# Patient Record
Sex: Female | Born: 1945 | Race: Black or African American | Hispanic: No | Marital: Single | Smoking: Never smoker
Health system: Southern US, Community
[De-identification: ages and names within clinical notes are randomized; demographics above are authoritative.]

## PROBLEM LIST (undated history)

## (undated) DIAGNOSIS — E785 Hyperlipidemia, unspecified: Secondary | ICD-10-CM

## (undated) DIAGNOSIS — K589 Irritable bowel syndrome without diarrhea: Secondary | ICD-10-CM

## (undated) DIAGNOSIS — M8580 Other specified disorders of bone density and structure, unspecified site: Secondary | ICD-10-CM

## (undated) DIAGNOSIS — T7840XA Allergy, unspecified, initial encounter: Secondary | ICD-10-CM

## (undated) DIAGNOSIS — J45909 Unspecified asthma, uncomplicated: Secondary | ICD-10-CM

## (undated) DIAGNOSIS — I10 Essential (primary) hypertension: Secondary | ICD-10-CM

## (undated) HISTORY — DX: Hyperlipidemia, unspecified: E78.5

## (undated) HISTORY — DX: Essential (primary) hypertension: I10

## (undated) HISTORY — DX: Irritable bowel syndrome without diarrhea: K58.9

## (undated) HISTORY — DX: Unspecified asthma, uncomplicated: J45.909

## (undated) HISTORY — PX: TUBAL LIGATION: SHX77

## (undated) HISTORY — DX: Allergy, unspecified, initial encounter: T78.40XA

## (undated) HISTORY — DX: Other specified disorders of bone density and structure, unspecified site: M85.80

## (undated) HISTORY — PX: ABDOMINAL HYSTERECTOMY: SHX81

## (undated) HISTORY — PX: BREAST SURGERY: SHX581

---

## 2013-11-30 ENCOUNTER — Ambulatory Visit (INDEPENDENT_AMBULATORY_CARE_PROVIDER_SITE_OTHER): Payer: BC Managed Care – PPO

## 2013-11-30 ENCOUNTER — Ambulatory Visit (INDEPENDENT_AMBULATORY_CARE_PROVIDER_SITE_OTHER): Payer: BC Managed Care – PPO | Admitting: Physician Assistant

## 2013-11-30 VITALS — BP 137/85 | HR 60 | Temp 98.4°F | Resp 20 | Ht 61.0 in | Wt 190.1 lb

## 2013-11-30 DIAGNOSIS — M79644 Pain in right finger(s): Secondary | ICD-10-CM

## 2013-11-30 NOTE — Progress Notes (Signed)
   Subjective:    Patient ID: Gabrielle Higgins, female    DOB: 1945/06/02, 68 y.o.   MRN: [id number]   PCP: No PCP Per Patient (Patient is visiting her daughter from [place]).  Chief Complaint  Patient presents with  . Finger Injury    Right Middle Finger-Got caught in the carpet    HPI  This 68 y.o. female presents for evaluation of RIGHT middle finger pain after catching it while pulling up wall-to-wall carpet in her home about a week ago.  It didn't really bother her until today, and she complains of pain all around the knuckle (PIP). She also notes swelling.  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  She left there this morning about 2 am to visit her daughter, who lives here.  Review of Systems     Objective:   Physical Exam  Constitutional: She is oriented to person, place, and time. She appears well-developed and well-nourished. She is active and cooperative. No distress.  BP 137/85  Pulse 60  Temp(Src) 98.4 F (36.9 C) (Oral)  Resp 20  Ht 5\' 1"  (1.549 m)  Wt 190 lb 2 oz (86.24 kg)  BMI 35.94 kg/m2  SpO2 97%   Eyes: Conjunctivae are normal.  Pulmonary/Chest: Effort normal.  Musculoskeletal:       Right wrist: Normal.       Right hand: She exhibits decreased range of motion (RIGHT middle finger, PIP; due to pain), tenderness and swelling (mild, of the middle finger). She exhibits normal capillary refill and no laceration. Normal sensation noted. Normal strength noted.  Neurological: She is alert and oriented to person, place, and time. No sensory deficit.  Psychiatric: She has a normal mood and affect. Her speech is normal and behavior is normal.      RIGHT Middle Finger: [facility] reading (PRIMARY) by  Dr. Perrin MalteseGuest. Normal bony structures. Minimal soft tissue swelling.      Assessment & Plan:  1. Pain of finger of right hand Fold-over finger splint. OTC NSAIDS. If symptoms persist, would seek hand specialist evaluation. - DG  Finger Middle Right; Future   Fernande Brashelle S. Deanna Boehlke, PA-C Physician Assistant-Certified [facility]

## 2014-12-27 ENCOUNTER — Other Ambulatory Visit: Payer: Self-pay

## 2015-07-05 IMAGING — CR DG FINGER MIDDLE 2+V*R*
1 series · 1 of 1 positions shown · non-contrast
Comparison: None.

CLINICAL DATA: Right long finger injury, pain.  Initial encounter.

EXAM:
RIGHT MIDDLE FINGER 2+V

[PA]
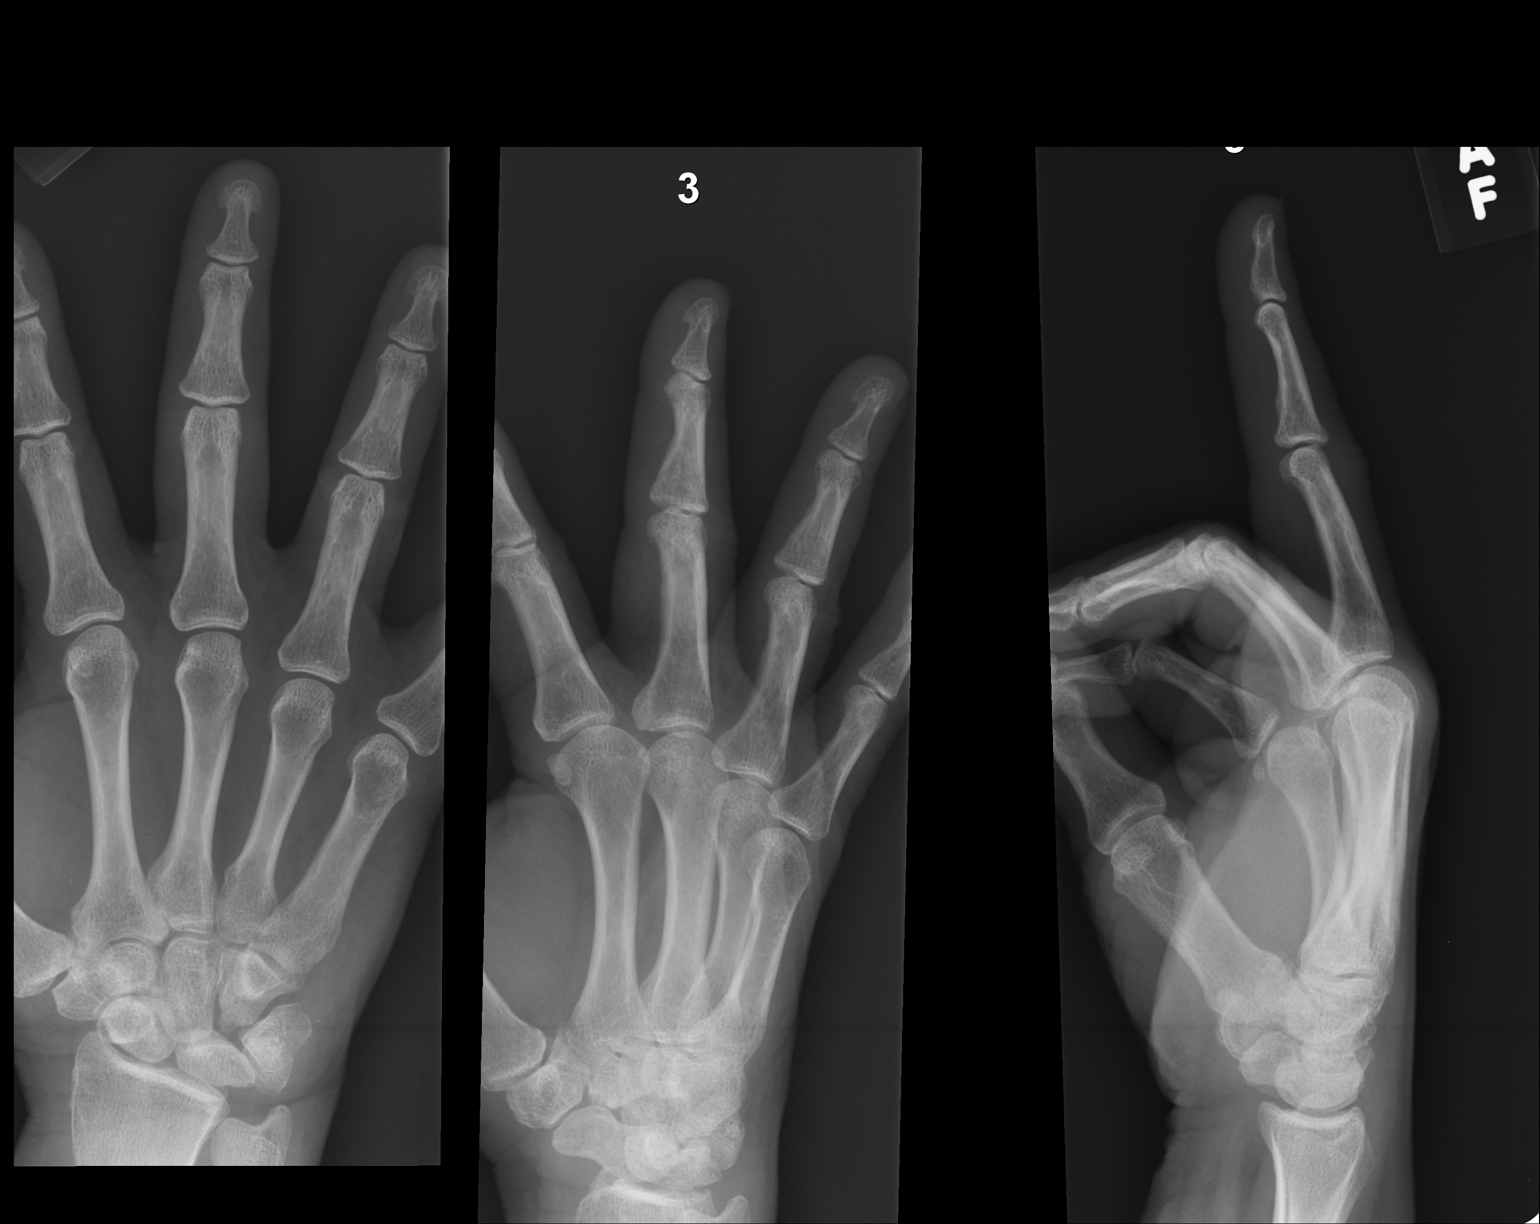

[1 of 1 positions shown; findings below may reference images not displayed]

FINDINGS: Imaged bones, joints and soft tissues appear normal.
IMPRESSION: Negative exam.

## 2019-01-15 ENCOUNTER — Emergency Department
Admission: EM | Admit: 2019-01-15 | Discharge: 2019-01-15 | Disposition: A | Discharge status: Other Acute Inpatient Hospital | Payer: Medicare HMO | Attending: Emergency Medicine | Admitting: Emergency Medicine

## 2019-01-15 ENCOUNTER — Emergency Department: Payer: Medicare HMO

## 2019-01-15 DIAGNOSIS — Z20828 Contact with and (suspected) exposure to other viral communicable diseases: Secondary | ICD-10-CM | POA: Insufficient documentation

## 2019-01-15 DIAGNOSIS — I4891 Unspecified atrial fibrillation: Secondary | ICD-10-CM | POA: Insufficient documentation

## 2019-01-15 DIAGNOSIS — E785 Hyperlipidemia, unspecified: Secondary | ICD-10-CM | POA: Insufficient documentation

## 2019-01-15 DIAGNOSIS — I10 Essential (primary) hypertension: Secondary | ICD-10-CM | POA: Insufficient documentation

## 2019-01-15 LAB — CBC AND DIFFERENTIAL
Absolute NRBC: 0 10*3/uL (ref 0.00–0.00)
Basophils Absolute Automated: 0.03 10*3/uL (ref 0.00–0.08)
Basophils Automated: 0.4 %
Eosinophils Absolute Automated: 0.16 10*3/uL (ref 0.00–0.44)
Eosinophils Automated: 2.2 %
Hematocrit: 41.5 % (ref 34.7–43.7)
Hgb: 13.6 g/dL (ref 11.4–14.8)
Immature Granulocytes Absolute: 0.02 10*3/uL (ref 0.00–0.07)
Immature Granulocytes: 0.3 %
Lymphocytes Absolute Automated: 2.52 10*3/uL (ref 0.42–3.22)
Lymphocytes Automated: 34.3 %
MCH: 30.6 pg (ref 25.1–33.5)
MCHC: 32.8 g/dL (ref 31.5–35.8)
MCV: 93.3 fL (ref 78.0–96.0)
MPV: 11.3 fL (ref 8.9–12.5)
Monocytes Absolute Automated: 0.8 10*3/uL (ref 0.21–0.85)
Monocytes: 10.9 %
Neutrophils Absolute: 3.82 10*3/uL (ref 1.10–6.33)
Neutrophils: 51.9 %
Nucleated RBC: 0 /100 WBC (ref 0.0–0.0)
Platelets: 235 10*3/uL (ref 142–346)
RBC: 4.45 10*6/uL (ref 3.90–5.10)
RDW: 13 % (ref 11–15)
WBC: 7.35 10*3/uL (ref 3.10–9.50)

## 2019-01-15 LAB — URINALYSIS REFLEX TO MICROSCOPIC EXAM - REFLEX TO CULTURE
Bilirubin, UA: NEGATIVE
Blood, UA: NEGATIVE
Glucose, UA: NEGATIVE
Ketones UA: NEGATIVE
Nitrite, UA: NEGATIVE
Protein, UR: NEGATIVE
Specific Gravity UA: 1.005 (ref 1.001–1.035)
Urine pH: 7 (ref 5.0–8.0)
Urobilinogen, UA: NEGATIVE mg/dL (ref 0.2–2.0)

## 2019-01-15 LAB — COMPREHENSIVE METABOLIC PANEL
ALT: 37 U/L (ref 0–55)
AST (SGOT): 33 U/L (ref 5–34)
Albumin/Globulin Ratio: 1.3 (ref 0.9–2.2)
Albumin: 3.8 g/dL (ref 3.5–5.0)
Alkaline Phosphatase: 72 U/L (ref 37–106)
Anion Gap: 12 (ref 5.0–15.0)
BUN: 15 mg/dL (ref 7.0–19.0)
Bilirubin, Total: 0.3 mg/dL (ref 0.2–1.2)
CO2: 21 mEq/L — ABNORMAL LOW (ref 22–29)
Calcium: 9.5 mg/dL (ref 7.9–10.2)
Chloride: 109 mEq/L (ref 100–111)
Creatinine: 0.8 mg/dL (ref 0.6–1.0)
Globulin: 3 g/dL (ref 2.0–3.6)
Glucose: 130 mg/dL — ABNORMAL HIGH (ref 70–100)
Potassium: 3.8 mEq/L (ref 3.5–5.1)
Protein, Total: 6.8 g/dL (ref 6.0–8.3)
Sodium: 142 mEq/L (ref 136–145)

## 2019-01-15 LAB — PT AND APTT
PT INR: 0.9 (ref 0.9–1.1)
PT: 12.2 s — ABNORMAL LOW (ref 12.6–15.0)
PTT: 27 s (ref 23–37)

## 2019-01-15 LAB — COVID-19 (SARS-COV-2): SARS CoV 2 Overall Result: NEGATIVE

## 2019-01-15 LAB — HEMOLYSIS INDEX: Hemolysis Index: 18 (ref 0–18)

## 2019-01-15 LAB — TSH: TSH: 2.52 u[IU]/mL (ref 0.35–4.94)

## 2019-01-15 LAB — GFR: EGFR: >60

## 2019-01-15 LAB — TROPONIN I: Troponin I: <0.01 ng/mL (ref 0.00–0.05)

## 2019-01-15 MED ORDER — DILTIAZEM HCL 5 MG/ML IV SOLN (WRAP)
20.00 mg | Freq: Once | INTRAVENOUS | Status: AC
Start: 2019-01-15 — End: 2019-01-15
  Administered 2019-01-15: 08:00:00 20 mg via INTRAVENOUS
  Filled 2019-01-15: qty 5

## 2019-01-15 MED ORDER — DILTIAZEM HCL 125 MG/125 ML IN DEXTROSE 5% IV SOLN
2.50 mg/h | INTRAVENOUS | Status: DC
Start: 2019-01-15 — End: 2019-01-15
  Administered 2019-01-15: 08:00:00 5 mg/h via INTRAVENOUS
  Filled 2019-01-15: qty 125

## 2019-01-15 MED ORDER — FAMOTIDINE 10 MG/ML IV SOLN (WRAP)
20.00 mg | Freq: Once | INTRAVENOUS | Status: AC
Start: 2019-01-15 — End: 2019-01-15
  Administered 2019-01-15: 09:00:00 20 mg via INTRAVENOUS
  Filled 2019-01-15: qty 2

## 2019-01-15 MED ORDER — ONDANSETRON HCL 4 MG/2ML IJ SOLN
4.00 mg | Freq: Once | INTRAMUSCULAR | Status: DC
Start: 2019-01-15 — End: 2019-01-15
  Filled 2019-01-15: qty 2

## 2019-01-15 MED ORDER — SODIUM CHLORIDE 0.9 % IV BOLUS
1000.00 mL | Freq: Once | INTRAVENOUS | Status: AC
Start: 2019-01-15 — End: 2019-01-15
  Administered 2019-01-15: 08:00:00 1000 mL via INTRAVENOUS

## 2019-01-15 NOTE — ED Triage Notes (Signed)
Jodi Freeman is a 73 y.o. female from home by Medic,for new onset of Afib HR 151 with CP, lightheaded upon waking up for work.   BP (!) 133/91    Pulse (!) 151    Temp 98.1 F (36.7 C) (Oral)    Resp (!) 30    Ht 5\' 1"  (1.549 m)    Wt 81.6 kg    SpO2 98%    BMI 34.01 kg/m

## 2019-01-15 NOTE — ED Notes (Signed)
Bed: ZO10  Expected date: 01/15/19  Expected time:   Means of arrival: [facility]  Comments:  Charity fundraiser

## 2019-01-15 NOTE — ED Notes (Signed)
Patient took 4-81mg  aspirin PTA

## 2019-01-15 NOTE — ED Notes (Signed)
Report given to [facility], patient transferred with belongings and diltiazem infusing

## 2019-01-15 NOTE — ED Provider Notes (Signed)
EMERGENCY DEPARTMENT HISTORY AND PHYSICAL EXAM     None        Date: 01/15/2019  Patient Name: Jodi Freeman    History of Presenting Illness     Chief Complaint   Patient presents with    Chest Pain    Atrial Fibrillation     151 HR        History Provided By: patient  Chief Complaint: palpitations, feels like she is going to pass out  Onset: this am  Timing: sudden  Location: cv  Quality: heart racing  Severity: severe  Modifying Factors: none  Associated Symptoms: feels fatigued    Additional History: Jodi Freeman is a 73 y.o. female.here via [place], started to get ready to go to work, felt extremely fatigued then her heart started to race and she felt like she was going to black out. No prior hx of similar symptoms. No cp or sob. No fever/cough. She is a Engineer, civil (consulting).     PCP: Pcp, Kathreen Cosier, MD      Current Facility-Administered Medications   Medication Dose Route Frequency Provider Last Rate Last Admin    dilTIAZem (CARDIZEM) 125 mg in dextrose 5% 125 mL premix  2.5-15 mg/hr Intravenous Continuous Elliot Cousin, MD 2.5 mL/hr at 01/15/19 0915 2.5 mg/hr at 01/15/19 0915     Current Outpatient Medications   Medication Sig Dispense Refill    albuterol (PROVENTIL) (2.5 MG/3ML) 0.083% nebulizer solution Take 2.5 mg by nebulization every 6 (six) hours as needed for Wheezing      atorvastatin (LIPITOR) 40 MG tablet Take 40 mg by mouth nightly      calcium carbonate (Calcium 600) 600 MG Tab tablet Take 600 mg by mouth 2 (two) times daily with meals      cyclobenzaprine (FLEXERIL) 5 MG tablet Take 5 mg by mouth 3 (three) times daily as needed for Muscle spasms      dicyclomine (BENTYL) 10 MG capsule Take 10 mg by mouth 3 (three) times daily before meals as needed      Multiple Vitamins-Minerals (CENTRUM SILVER PO) Take by mouth      NIFEdipine ER (ADALAT CC) 30 MG 24 hr tablet Take 30 mg by mouth daily      Omega-3 Fatty Acids (Omega-3 Fish Oil) 500 MG Cap Take by mouth         Past History     Past  Medical History:  Past Medical History:   Diagnosis Date    Asthma     HLD (hyperlipidemia)     Hypertension     IBS (irritable bowel syndrome)        Past Surgical History:  History reviewed. No pertinent surgical history.    Family History:  No family history on file.    Social History:  Social History     Tobacco Use    Smoking status: Never Smoker    Smokeless tobacco: Never Used   Substance Use Topics    Alcohol use: Not Currently     Comment: socially    Drug use: Never       Allergies:  Allergies   Allergen Reactions    Ciprofloxacin     Dye [Iodinated Diagnostic Agents]     Sulfa Antibiotics        Review of Systems     Review of Systems   Constitutional: Negative for chills and fever.   Cardiovascular: Positive for palpitations. Negative for chest pain.   Neurological: Positive for dizziness.  Psychiatric/Behavioral: Negative for substance abuse and suicidal ideas.   All other systems reviewed and are negative.      Physical Exam   BP 122/57    Pulse 71    Temp 98.1 F (36.7 C) (Oral)    Resp 20    Ht 5\' 1"  (1.549 m)    Wt 81.6 kg    SpO2 98%    BMI 34.01 kg/m     Physical Exam   Constitutional: She is oriented to person, place, and time and well-developed, well-nourished, and in no distress. No distress.   HENT:   Head: Normocephalic and atraumatic.   Eyes: Conjunctivae are normal. Right eye exhibits no discharge. Left eye exhibits no discharge. No scleral icterus.   Cardiovascular: Intact distal pulses.   Irr, irr  tachycardic   Pulmonary/Chest: Effort normal and breath sounds normal. No respiratory distress. She has no wheezes. She has no rales.   Abdominal: Soft. Bowel sounds are normal. She exhibits no distension. There is no abdominal tenderness. There is no rebound and no guarding.   Musculoskeletal:         General: No edema.   Neurological: She is alert and oriented to person, place, and time. GCS score is 15.   Skin: Skin is warm and dry. She is not diaphoretic.   Psychiatric: Mood  and affect normal.       Diagnostic Study Results     Labs -     Results     Procedure Component Value Units Date/Time    COVID-19 (SARS-COV-2) Verne Carrow Rapid) [[id number]] Collected: 01/15/19 0813    Specimen: Nasopharyngeal Swab from Nasopharynx Updated: 01/15/19 0844     Purpose of COVID testing Screening     SARS-CoV-2 Specimen Source Nasopharyngeal     SARS CoV 2 Overall Result Negative    Narrative:      o Collect and clearly label specimen type:  o Upper respiratory specimen: One Nasopharyngeal Dry Swab NO  Transport Media.  o Hand deliver to laboratory ASAP    UA Reflex to Micro - Reflex to Culture [[id number]]  (Abnormal) Collected: 01/15/19 0813     Updated: 01/15/19 0825     Urine Type Urine, Clean Ca     Color, UA Colorless     Clarity, UA Clear     Specific Gravity UA 1.005     Urine pH 7.0     Leukocyte Esterase, UA Trace     Nitrite, UA Negative     Protein, UR Negative     Glucose, UA Negative     Ketones UA Negative     Urobilinogen, UA Negative mg/dL      Bilirubin, UA Negative     Blood, UA Negative     RBC, UA 0 - 2 /hpf      WBC, UA 0 - 5 /hpf      Squamous Epithelial Cells, Urine 0 - 5 /hpf      Hyaline Casts, UA 0 - 3 /lpf      Urine Mucus Present    Narrative:      Replace urinary catheter prior to obtaining the urine culture  if it has been in place for greater than or equal to 14  days:->N/A No Foley  Indications for U/A Reflex to Micro - Reflex to  Culture:->Suprapubic Pain/Tenderness or Dysuria    TSH [[id number]] Collected: 01/15/19 0641    Specimen: Blood Updated: 01/15/19 0731     TSH 2.52 uIU/mL  Narrative:      Replace urinary catheter prior to obtaining the urine culture  if it has been in place for greater than or equal to 14  days:->N/A No Foley  Indications for U/A Reflex to Micro - Reflex to  Culture:->Suprapubic Pain/Tenderness or Dysuria    Hemolysis index [[id number]] Collected: 01/15/19 0641     Updated: 01/15/19 0731     Hemolysis Index 18    Narrative:      Replace urinary  catheter prior to obtaining the urine culture  if it has been in place for greater than or equal to 14  days:->N/A No Foley  Indications for U/A Reflex to Micro - Reflex to  Culture:->Suprapubic Pain/Tenderness or Dysuria    GFR [[id number]] Collected: 01/15/19 0641     Updated: 01/15/19 0731     EGFR >60.0    Narrative:      Replace urinary catheter prior to obtaining the urine culture  if it has been in place for greater than or equal to 14  days:->N/A No Foley  Indications for U/A Reflex to Micro - Reflex to  Culture:->Suprapubic Pain/Tenderness or Dysuria    Comprehensive metabolic panel [[id number]]  (Abnormal) Collected: 01/15/19 0641    Specimen: Blood Updated: 01/15/19 0731     Glucose 130 mg/dL      BUN 29.5 mg/dL      Creatinine 0.8 mg/dL      Sodium 284 mEq/L      Potassium 3.8 mEq/L      Chloride 109 mEq/L      CO2 21 mEq/L      Calcium 9.5 mg/dL      Protein, Total 6.8 g/dL      Albumin 3.8 g/dL      AST (SGOT) 33 U/L      ALT 37 U/L      Alkaline Phosphatase 72 U/L      Bilirubin, Total 0.3 mg/dL      Globulin 3.0 g/dL      Albumin/Globulin Ratio 1.3     Anion Gap 12.0    Narrative:      Replace urinary catheter prior to obtaining the urine culture  if it has been in place for greater than or equal to 14  days:->N/A No Foley  Indications for U/A Reflex to Micro - Reflex to  Culture:->Suprapubic Pain/Tenderness or Dysuria    Troponin I [[id number]] Collected: 01/15/19 0641    Specimen: Blood Updated: 01/15/19 0731     Troponin I <0.01 ng/mL     Narrative:      Replace urinary catheter prior to obtaining the urine culture  if it has been in place for greater than or equal to 14  days:->N/A No Foley  Indications for U/A Reflex to Micro - Reflex to  Culture:->Suprapubic Pain/Tenderness or Dysuria    PT/APTT [[id number]]  (Abnormal) Collected: 01/15/19 0641     Updated: 01/15/19 0708     PT 12.2 sec      PT INR 0.9     PTT 27 sec     Narrative:      Replace urinary catheter prior to obtaining the urine  culture  if it has been in place for greater than or equal to 14  days:->N/A No Foley  Indications for U/A Reflex to Micro - Reflex to  Culture:->Suprapubic Pain/Tenderness or Dysuria    CBC and differential [[id number]] Collected: 01/15/19 0641    Specimen: Blood Updated: 01/15/19 0656     WBC 7.35 x10 3/uL  Hgb 13.6 g/dL      Hematocrit 19.1 %      Platelets 235 x10 3/uL      RBC 4.45 x10 6/uL      MCV 93.3 fL      MCH 30.6 pg      MCHC 32.8 g/dL      RDW 13 %      MPV 11.3 fL      Neutrophils 51.9 %      Lymphocytes Automated 34.3 %      Monocytes 10.9 %      Eosinophils Automated 2.2 %      Basophils Automated 0.4 %      Immature Granulocytes 0.3 %      Nucleated RBC 0.0 /100 WBC      Neutrophils Absolute 3.82 x10 3/uL      Lymphocytes Absolute Automated 2.52 x10 3/uL      Monocytes Absolute Automated 0.80 x10 3/uL      Eosinophils Absolute Automated 0.16 x10 3/uL      Basophils Absolute Automated 0.03 x10 3/uL      Immature Granulocytes Absolute 0.02 x10 3/uL      Absolute NRBC 0.00 x10 3/uL     Narrative:      Replace urinary catheter prior to obtaining the urine culture  if it has been in place for greater than or equal to 14  days:->N/A No Foley  Indications for U/A Reflex to Micro - Reflex to  Culture:->Suprapubic Pain/Tenderness or Dysuria          Radiologic Studies -   Radiology Results (24 Hour)     Procedure Component Value Units Date/Time    XR Chest  AP Portable [[id number]] Collected: 01/15/19 0741    Order Status: Completed Updated: 01/15/19 0744    Narrative:      HISTORY: New onset atrial fibrillation.    TECHNIQUE: Single frontal view of the chest was obtained.     PRIORS: None.    FINDINGS: The lung fields are clear. There are no significant pleural  effusions. The cardiac silhouette and hila are normal. The trachea is  midline. The bony structures are essentially unremarkable.       Impression:       No  radiographic evidence of active lung disease.    Georgana Curio, MD   01/15/2019 7:42 AM       .      Medical Decision Making   I am the first provider for this patient.    I reviewed the vital signs, available nursing notes, past medical history, past surgical history, family history and social history.    Vital Signs-Reviewed the patient's vital signs.     Patient Vitals for the past 12 hrs:   BP Temp Pulse Resp   01/15/19 1400 122/57 -- 71 20   01/15/19 1330 110/59 -- 73 20   01/15/19 1230 107/59 -- 71 20   01/15/19 1200 103/57 -- 93 20   01/15/19 1100 103/57 -- 94 20   01/15/19 1030 112/76 -- (!) 105 20   01/15/19 1000 125/53 -- 82 18   01/15/19 0930 102/50 -- 86 20   01/15/19 0929 94/53 -- 92 21   01/15/19 0900 (!) 87/51 -- 81 18   01/15/19 0809 93/53 -- (!) 117 21   01/15/19 0802 123/50 -- (!) 137 20   01/15/19 0648 -- 98.1 F (36.7 C) -- --   01/15/19 0642 (!) 133/91 98.1 F (36.7 C) (!) 151  18       Pulse Oximetry Analysis - 99% on RA  Cardiac Monitor:   Rate:151   Rhythm:  Rapid afib  EKG:  Interpreted by the EP.   Time Interpreted:    Rate: 141   Rhythm: rapid afib   Interpretation:no stemi      ED Course: heart rate controlled after cardizem bolus and drip. Bp stable. Stable to [facility] to [facility]    D/W Dr.Broom, will accept to [facility]    Provider Notes: 1300: stable, daughter in room, updated. Want admission.   Procedures:  CRITICAL CARE: The high probability of sudden, clinically significant deterioration in the patient's condition required the highest level of my preparedness to intervene urgently.    The services I provided to this patient were to treat and/or prevent clinically significant deterioration that could result in: cardiogenic shock/heart failure.  Services included the following: chart data review, reviewing nursing notes and/or old charts, documentation time, consultant collaboration regarding findings and treatment options, medication orders and management, direct patient care, re-evaluations, vital sign assessments and ordering, interpreting and reviewing diagnostic studies/lab  tests.    Aggregate critical care time was 35 minutes, which includes only time during which I was engaged in work directly related to the patient's care, as described above, whether at the bedside or elsewhere in the Emergency Department.  It did not include time spent performing other reported procedures or the services of residents, students, nurses or physician assistants.        Diagnosis     Clinical Impression:   1. New onset a-fib    2. Atrial fibrillation with rapid ventricular response        _______________________________       Elliot Cousin, MD  01/15/19 1430
